# Patient Record
Sex: Male | Born: 1990 | Race: Black or African American | Hispanic: No | Marital: Single | Smoking: Never smoker
Health system: Southern US, Community
[De-identification: ages and names within clinical notes are randomized; demographics above are authoritative.]

---

## 2020-01-30 ENCOUNTER — Emergency Department (HOSPITAL_COMMUNITY): Payer: No Typology Code available for payment source

## 2020-01-30 ENCOUNTER — Emergency Department (HOSPITAL_COMMUNITY)
Admission: EM | Admit: 2020-01-30 | Discharge: 2020-01-31 | Disposition: A | Discharge status: Home / Self Care | Payer: No Typology Code available for payment source | Attending: Emergency Medicine | Admitting: Emergency Medicine

## 2020-01-30 ENCOUNTER — Encounter (HOSPITAL_COMMUNITY): Payer: Self-pay | Admitting: Emergency Medicine

## 2020-01-30 ENCOUNTER — Other Ambulatory Visit: Payer: Self-pay

## 2020-01-30 DIAGNOSIS — S299XXA Unspecified injury of thorax, initial encounter: Secondary | ICD-10-CM | POA: Diagnosis present

## 2020-01-30 DIAGNOSIS — S20219A Contusion of unspecified front wall of thorax, initial encounter: Secondary | ICD-10-CM | POA: Diagnosis not present

## 2020-01-30 DIAGNOSIS — Y998 Other external cause status: Secondary | ICD-10-CM | POA: Insufficient documentation

## 2020-01-30 DIAGNOSIS — Y9389 Activity, other specified: Secondary | ICD-10-CM | POA: Diagnosis not present

## 2020-01-30 DIAGNOSIS — Y929 Unspecified place or not applicable: Secondary | ICD-10-CM | POA: Insufficient documentation
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
## 2020-01-31 NOTE — Discharge Instructions (Signed)
Use ice, Tylenol and ibuprofen as needed for pain.  Return for new or worsening symptoms
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
## 2022-07-08 ENCOUNTER — Other Ambulatory Visit: Payer: Self-pay

## 2022-07-08 ENCOUNTER — Emergency Department (HOSPITAL_COMMUNITY)
Admission: EM | Admit: 2022-07-08 | Discharge: 2022-07-08 | Disposition: A | Discharge status: Home / Self Care | Payer: Medicaid Other | Attending: Emergency Medicine | Admitting: Emergency Medicine

## 2022-07-08 ENCOUNTER — Encounter (HOSPITAL_COMMUNITY): Payer: Self-pay

## 2022-07-08 DIAGNOSIS — T161XXA Foreign body in right ear, initial encounter: Secondary | ICD-10-CM | POA: Insufficient documentation

## 2022-07-08 DIAGNOSIS — W448XXA Other foreign body entering into or through a natural orifice, initial encounter: Secondary | ICD-10-CM | POA: Insufficient documentation

## 2022-07-08 NOTE — ED Triage Notes (Signed)
Patient got a cotton swab stuck in his right ear since Saturday.

## 2022-07-08 NOTE — ED Provider Notes (Signed)
[facility]-EMERGENCY DEPT Provider Note   CSN: [id number] Arrival date & time: 07/08/22  1601     History  Chief Complaint  Patient presents with   Foreign Body in Ear    Bentleigh Stankus is a 31 y.o. male.   Foreign Body in Ear  Patient is a 31 year old male with no pertinent past medical history presenting for foreign body in right ear.  He states that he uses Q-tips to clean his EAC.  He states that  2 days ago he feels that he got a small amount of cotton stuck in his ear.  He has had somewhat muffled hearing.     Home Medications Prior to Admission medications   Not on File      Allergies    Penicillins    Review of Systems   Review of Systems  Physical Exam Updated Vital Signs BP (!) 131/54 (BP Location: Left Arm)   Pulse 83   Temp 98.4 F (36.9 C) (Oral)   Resp 18   SpO2 100%  Physical Exam Vitals and nursing note reviewed.  Constitutional:      General: He is not in acute distress.    Appearance: Normal appearance. He is not ill-appearing.  HENT:     Head: Normocephalic and atraumatic.     Ears:     Comments: Right EAC with cotton impacted Eyes:     General: No scleral icterus.       Right eye: No discharge.        Left eye: No discharge.     Conjunctiva/sclera: Conjunctivae normal.  Pulmonary:     Effort: Pulmonary effort is normal.     Breath sounds: No stridor.  Neurological:     Mental Status: He is alert and oriented to person, place, and time. Mental status is at baseline.     ED Results / Procedures / Treatments   Labs (all labs ordered are listed, but only abnormal results are displayed) Labs Reviewed - No data to display  EKG None  Radiology No results found.  Procedures .Foreign Body Removal  Date/Time: 07/08/2022 4:47 PM  Performed by: Gailen Shelter, PA Authorized by: Gailen Shelter, PA  Consent: Verbal consent obtained. Consent given by: patient Patient understanding: patient states  understanding of the procedure being performed Patient consent: the patient's understanding of the procedure matches consent given Procedure consent: procedure consent matches procedure scheduled Relevant documents: relevant documents present and verified Test results: test results available and properly labeled Imaging studies: imaging studies available Patient identity confirmed: verbally with patient and arm band  Sedation: Patient sedated: no  Patient restrained: no Complexity: simple 1 objects recovered. Objects recovered: cotton ball Post-procedure assessment: foreign body removed Comments: No residual foreign body after extraction      Medications Ordered in ED Medications - No data to display  ED Course/ Medical Decision Making/ A&P                           Medical Decision Making  Patient is a 31 year old male with no pertinent past medical history presenting for foreign body in right ear.  He states that he uses Q-tips to clean his EAC.  He states that  2 days ago he feels that he got a small amount of cotton stuck in his ear.  He has had somewhat muffled hearing.  Cotton extracted from [facility] with alligator forceps Patient tolerated procedure well.  Hearing much  improved.  EAC slightly erythematous but without any edema or discomfort.  His ear.  Will discharge home with follow-up with PCP.  Final Clinical Impression(s) / ED Diagnoses Final diagnoses:  Foreign body of right ear, initial encounter    Rx / DC Orders ED Discharge Orders     None         Gailen Shelter, [place] 07/08/22 1647    Tegeler, Canary Brim, MD 07/08/22 1719
